# Patient Record
Sex: Male | Born: 1970 | Hispanic: Yes
Health system: Southern US, Community
[De-identification: ages and names within clinical notes are randomized; demographics above are authoritative.]

---

## 2019-12-27 ENCOUNTER — Encounter (HOSPITAL_COMMUNITY): Payer: Self-pay

## 2019-12-27 ENCOUNTER — Other Ambulatory Visit: Payer: Self-pay

## 2019-12-27 ENCOUNTER — Emergency Department (HOSPITAL_COMMUNITY)
Admission: EM | Admit: 2019-12-27 | Discharge: 2019-12-27 | Disposition: A | Discharge status: Home / Self Care | Payer: Worker's Compensation | Attending: Emergency Medicine | Admitting: Emergency Medicine

## 2019-12-27 ENCOUNTER — Emergency Department (HOSPITAL_COMMUNITY): Payer: Worker's Compensation

## 2019-12-27 DIAGNOSIS — Z23 Encounter for immunization: Secondary | ICD-10-CM | POA: Diagnosis not present

## 2019-12-27 DIAGNOSIS — S61225A Laceration with foreign body of left ring finger without damage to nail, initial encounter: Secondary | ICD-10-CM

## 2019-12-27 DIAGNOSIS — Y990 Civilian activity done for income or pay: Secondary | ICD-10-CM | POA: Diagnosis not present

## 2019-12-27 DIAGNOSIS — S61325A Laceration with foreign body of left ring finger with damage to nail, initial encounter: Secondary | ICD-10-CM | POA: Diagnosis present

## 2019-12-27 DIAGNOSIS — Y288XXA Contact with other sharp object, undetermined intent, initial encounter: Secondary | ICD-10-CM | POA: Diagnosis not present

## 2019-12-27 MED ORDER — CEPHALEXIN 250 MG PO CAPS
500.0000 mg | ORAL_CAPSULE | Freq: Once | ORAL | Status: AC
  Administered 2019-12-27: 500 mg via ORAL
  Filled 2019-12-27: qty 2

## 2019-12-27 MED ORDER — LIDOCAINE HCL (PF) 1 % IJ SOLN
10.0000 mL | Freq: Once | INTRAMUSCULAR | Status: AC
  Administered 2019-12-27: 10 mL via INTRADERMAL
  Filled 2019-12-27: qty 10

## 2019-12-27 MED ORDER — BACITRACIN ZINC 500 UNIT/GM EX OINT
TOPICAL_OINTMENT | Freq: Once | CUTANEOUS | Status: AC
  Administered 2019-12-27: 1 via TOPICAL
  Filled 2019-12-27: qty 1.8

## 2019-12-27 MED ORDER — CEPHALEXIN 500 MG PO CAPS: 500.0000 mg | ORAL_CAPSULE | Freq: Four times a day (QID) | ORAL | 0 refills | Status: AC

## 2019-12-27 MED ORDER — TETANUS-DIPHTH-ACELL PERTUSSIS 5-2.5-18.5 LF-MCG/0.5 IM SUSP
0.5000 mL | Freq: Once | INTRAMUSCULAR | Status: AC
  Administered 2019-12-27: 0.5 mL via INTRAMUSCULAR
  Filled 2019-12-27: qty 0.5

## 2019-12-27 NOTE — ED Notes (Signed)
One touch pt, see provider's assessment.  Pt is alert and oriented states he has no pain.  Provider is working on laceration.

## 2019-12-27 NOTE — ED Provider Notes (Signed)
[facility] EMERGENCY DEPARTMENT Provider Note   CSN: [id number] Arrival date & time: 12/27/19  1651     History Chief Complaint  Patient presents with  . Finger Injury    Connor Hale is a 49 y.o. male who presents for evaluation of laceration noted to his left fourth digit that occurred this afternoon at 4:30 PM while at work.  He reports that he was moving things around and states that he hit his hand against a hopper.  He does not know when his last tetanus shot was.  He denies any numbness/weakness.  The history is provided by the patient.       History reviewed. No pertinent past medical history.  There are no problems to display for this patient.   History reviewed. No pertinent surgical history.     No family history on file.  Social History   Tobacco Use  . Smoking status: Not on file  Substance Use Topics  . Alcohol use: Not on file  . Drug use: Not on file    Home Medications Prior to Admission medications   Medication Sig Start Date End Date Taking? Authorizing Provider  cephALEXin (KEFLEX) 500 MG capsule Take 1 capsule (500 mg total) by mouth 4 (four) times daily for 7 days. 12/27/19 01/03/20  Maxwell Caul, PA-C    Allergies    Patient has no allergy information on record.  Review of Systems   Review of Systems  Skin: Positive for wound.  Neurological: Negative for weakness and numbness.  All other systems reviewed and are negative.   Physical Exam Updated Vital Signs BP (!) 153/88 (BP Location: Right Arm)   Pulse 63   Temp 98.3 F (36.8 C) (Oral)   Resp 18   Ht 5\' 7"  (1.702 m)   Wt 74.8 kg   SpO2 99%   BMI 25.84 kg/m   Physical Exam Vitals and nursing note reviewed.  Constitutional:      Appearance: He is well-developed.  HENT:     Head: Normocephalic and atraumatic.  Eyes:     General: No scleral icterus.       Right eye: No discharge.        Left eye: No discharge.     Conjunctiva/sclera:  Conjunctivae normal.  Cardiovascular:     Pulses:          Radial pulses are 2+ on the right side and 2+ on the left side.  Pulmonary:     Effort: Pulmonary effort is normal.  Musculoskeletal:     Comments: Full range of motion of both the DIP and PIP of the left fourth digit with any difficulty.  He can fully extend all 5 digits without difficulty and easily make a fist.  Skin:    General: Skin is warm and dry.     Comments: Vitiligo noted diffusely. Good distal cap refill.  LUE is not dusky in appearance or cool to touch.  2 cm jagged, irregular laceration noted to the dorsal aspect of the left fourth digit in between the PIP and the DIP.  No joint involvement.  Neurological:     Mental Status: He is alert.  Psychiatric:        Speech: Speech normal.        Behavior: Behavior normal.     ED Results / Procedures / Treatments   Labs (all labs ordered are listed, but only abnormal results are displayed) Labs Reviewed - No data to display  EKG  None  Radiology DG Finger Ring Left  Result Date: 12/27/2019 CLINICAL DATA:  Laceration left finger ring EXAM: LEFT RING FINGER 2+V COMPARISON:  None. FINDINGS: There is no evidence of fracture or dislocation. There is no evidence of arthropathy or other focal bone abnormality. Laceration of the dorsal and lateral soft tissues of the left fourth digit. Two punctate metallic densities within the lateral and dorsal subcutaneus soft tissues of left fourth digit: Most distal one at the level of the base of the middle phalanx and the other at the level of the mid body of the proximal phalanx likely represent retained radiopaque foreign body. Soft tissues are unremarkable. IMPRESSION: 1. Left fourth digit lacerations with two associated punctate metallic densities likely representing retained foreign bodies as described above. 2. No acute fracture or dislocation of the visualized bones of the left fourth digit. Electronically Signed   By: Tish Frederickson  M.D.   On: 12/27/2019 17:37    Procedures .Marland KitchenLaceration Repair  Date/Time: 12/27/2019 8:07 PM Performed by: Maxwell Caul, PA-C Authorized by: Maxwell Caul, PA-C   Consent:    Consent obtained:  Verbal   Consent given by:  Patient   Risks discussed:  Infection, need for additional repair, pain, poor cosmetic result and poor wound healing   Alternatives discussed:  No treatment and delayed treatment Universal protocol:    Procedure explained and questions answered to patient or proxy's satisfaction: yes     Relevant documents present and verified: yes     Test results available and properly labeled: yes     Imaging studies available: yes     Required blood products, implants, devices, and special equipment available: yes     Site/side marked: yes     Immediately prior to procedure, a time out was called: yes     Patient identity confirmed:  Verbally with patient Anesthesia (see MAR for exact dosages):    Anesthesia method:  Local infiltration   Local anesthetic:  Lidocaine 1% w/o epi Laceration details:    Location:  Finger   Finger location:  L ring finger   Length (cm):  2 Repair type:    Repair type:  Simple Pre-procedure details:    Preparation:  Patient was prepped and draped in usual sterile fashion Exploration:    Hemostasis achieved with:  Direct pressure   Wound exploration: wound explored through full range of motion     Wound extent: no foreign bodies/material noted   Treatment:    Area cleansed with:  Betadine   Amount of cleaning:  Extensive   Irrigation method:  Syringe   Visualized foreign bodies/material removed: no   Skin repair:    Repair method:  Sutures   Suture size:  5-0   Suture material:  Nylon   Suture technique:  Simple interrupted   Number of sutures:  5 Approximation:    Approximation:  Close Post-procedure details:    Dressing:  Antibiotic ointment   Patient tolerance of procedure:  Tolerated well, no immediate  complications Comments:     Once the wound was anesthetized and thoroughly extensively irrigated with sterile saline, it was explored through full range of motion.  Unable to locate foreign bodies as identified on x-rays.  On evaluation of the wound, I was able to see the tendon sheath clearly.  I did not see any involvement of the tendon and no evidence of cut or laceration in the tendon.  I examined him full with full range of motion and  showed no obvious tendon defect.   (including critical care time)  Medications Ordered in ED Medications  lidocaine (PF) (XYLOCAINE) 1 % injection 10 mL (10 mLs Intradermal Given 12/27/19 1947)  Tdap (BOOSTRIX) injection 0.5 mL (0.5 mLs Intramuscular Given 12/27/19 1956)  cephALEXin (KEFLEX) capsule 500 mg (500 mg Oral Given 12/27/19 2019)  bacitracin ointment (1 application Topical Given 12/27/19 2028)    ED Course  I have reviewed the triage vital signs and the nursing notes.  Pertinent labs & imaging results that were available during my care of the patient were reviewed by me and considered in my medical decision making (see chart for details).    MDM Rules/Calculators/A&P                          49 year old male who presents for evaluation of laceration noted to left fourth digit that occurred this afternoon.  No numbness/weakness.  Tetanus is not up-to-date.  On initially arrival, he is afebrile, nontoxic-appearing.  He is neurovascularly intact.  On exam, he has a 2 cm laceration noted to the dorsal aspect of the left fourth digit between the PIP and the DIP.  No joint involvement.  Full range of motion without any difficulty.  Plan for wound care, tetanus, x-ray.  X-ray shows laceration left fourth digit.  There are 2 punctate metallic densities that could be retained foreign bodies.  No acute fracture or dislocation.  Laceration repaired as documented above.  I was unable to locate the small metal fragments that were seen on the x-ray.  There is a  possibility that these were washed out when I irrigated the wound but I thoroughly extensively evaluated and did not see any evidence of foreign body.  Patient tolerated procedure well.  When I evaluated the wound, the tendon was visible.  I thoroughly and extensively evaluated the tendon through full range of motion did not see any evidence of tendon disruption.  Additionally, he had full flexion/tension of both the DIP and PIP without any difficulty.  Given that the tendon was visualized, will have him follow-up with outpatient hand.  Additionally, will put him on antibiotics.  Patient known drug allergies. At this time, patient exhibits no emergent life-threatening condition that require further evaluation in ED. Patient had ample opportunity for questions and discussion. All patient's questions were answered with full understanding. Strict return precautions discussed. Patient expresses understanding and agreement to plan.   Portions of this note were generated with Scientist, clinical (histocompatibility and immunogenetics). Dictation errors may occur despite best attempts at proofreading.   Final Clinical Impression(s) / ED Diagnoses Final diagnoses:  Laceration of left ring finger with foreign body, nail damage status unspecified, initial encounter    Rx / DC Orders ED Discharge Orders         Ordered    cephALEXin (KEFLEX) 500 MG capsule  4 times daily        12/27/19 2015           Rosana Hoes 12/27/19 2054    Sabino Donovan, MD 12/28/19 1454

## 2019-12-27 NOTE — Discharge Instructions (Signed)
Keep the wound clean and dry for the first 24 hours. After that you may gently clean the wound with soap and water. Make sure to pat dry the wound before covering it with any dressing. You can use topical antibiotic ointment and bandage. Ice and elevate for pain relief.   Take antibiotics as directed. Please take all of your antibiotics until finished.  You can take Tylenol or Ibuprofen as directed for pain. You can alternate Tylenol and Ibuprofen every 4 hours for additional pain relief.   As we discussed, while evaluating your wound, there was evidence that the tendon was visible.  There is no obvious tendon injury but because of this, we will have you follow-up with a hand doctor.  Please call his office and arrange for an appointment.  Additionally I have provided you information regarding occupational health that you can follow-up with in terms of filing workers comp.  Return to the Emergency Department, your primary care doctor, or the [facility] in 5-7 days for suture removal.   Monitor closely for any signs of infection. Return to the Emergency Department for any worsening redness/swelling of the area that begins to spread, drainage from the site, worsening pain, fever or any other worsening or concerning symptoms.

## 2019-12-27 NOTE — ED Triage Notes (Signed)
Pt cut his left ringer finger while at work today, laceration noted, bleeding controlled. Unknown tdap

## 2021-05-31 IMAGING — DX DG FINGER RING 2+V*L*
3 series · 3 of 3 positions shown · non-contrast
Comparison: None.
COMPARISON: None.

Addendum:
CLINICAL DATA: Laceration left finger ring

EXAM:
LEFT RING FINGER 2+V

[finger ap]
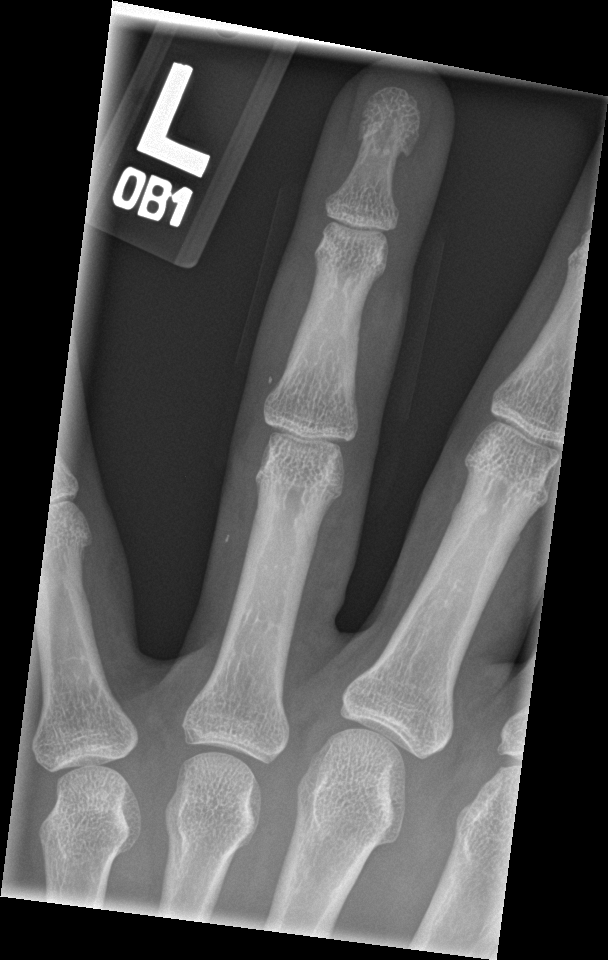

[finger obl]
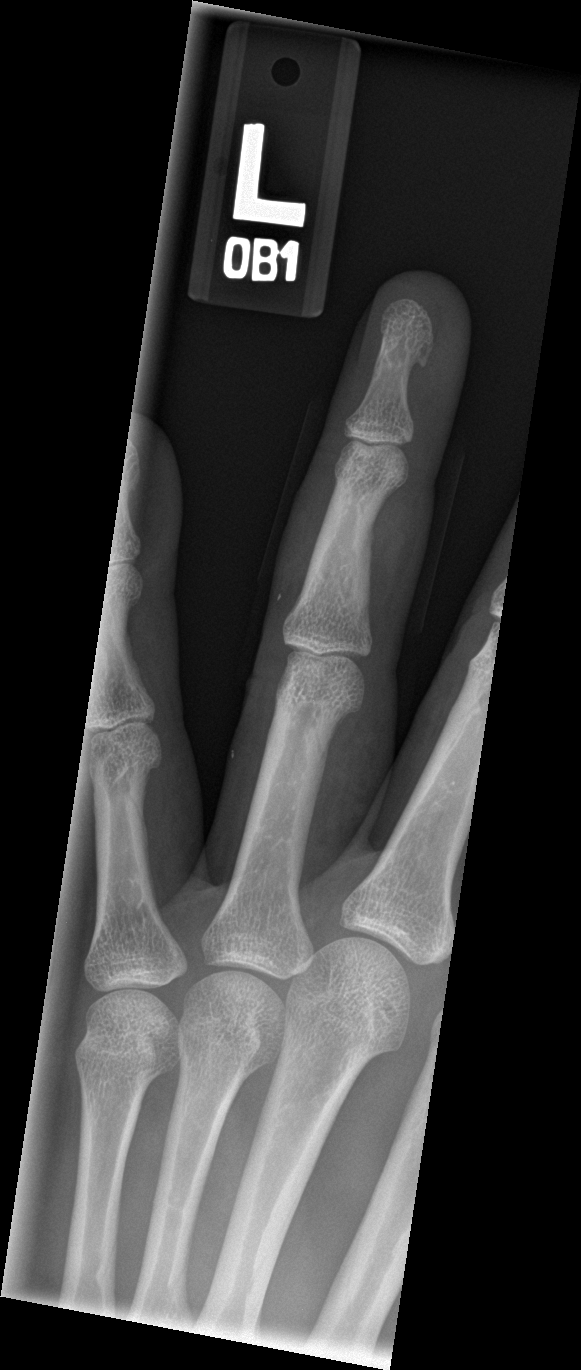

[finger lat]
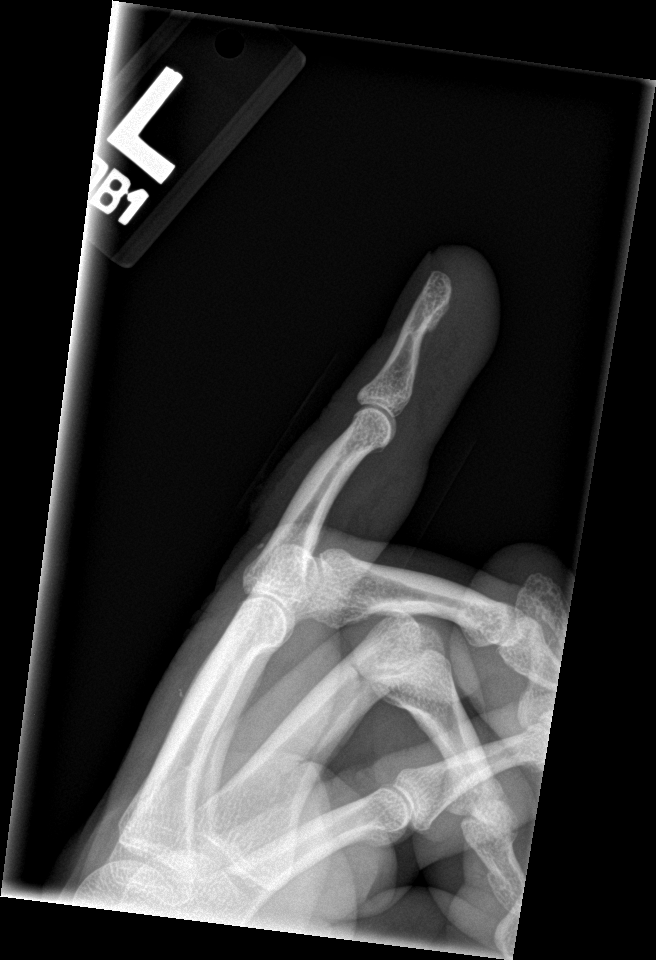

[3 of 3 positions shown; findings below may reference images not displayed]

FINDINGS: There is no evidence of fracture or dislocation. There is no
evidence of arthropathy or other focal bone abnormality.

Laceration of the dorsal and lateral soft tissues of the left fourth
digit. Two punctate metallic densities within the lateral and dorsal
subcutaneus soft tissues of left fourth digit: Most distal one at
the level of the base of the middle phalanx and the other at the
level of the mid body of the proximal phalanx likely represent
retained radiopaque foreign body. Soft tissues are unremarkable.
IMPRESSION: 1. Left fourth digit lacerations with two associated punctate
metallic densities likely representing retained foreign bodies as
described above.
2. No acute fracture or dislocation of the visualized bones of the
left fourth digit.

ADDENDUM:
These results were called by telephone at the time of interpretation
on 12/27/2019 at [DATE] to provider MIYUKI AROCHO , who
verbally acknowledged these results.

*** End of Addendum ***
FINDINGS: There is no evidence of fracture or dislocation. There is no
evidence of arthropathy or other focal bone abnormality.

Laceration of the dorsal and lateral soft tissues of the left fourth
digit. Two punctate metallic densities within the lateral and dorsal
subcutaneus soft tissues of left fourth digit: Most distal one at
the level of the base of the middle phalanx and the other at the
level of the mid body of the proximal phalanx likely represent
retained radiopaque foreign body. Soft tissues are unremarkable.
IMPRESSION: 1. Left fourth digit lacerations with two associated punctate
metallic densities likely representing retained foreign bodies as
described above.
2. No acute fracture or dislocation of the visualized bones of the
left fourth digit.
